# Patient Record
Sex: Male | Born: 1995 | Race: White | Hispanic: No | Marital: Single | Smoking: Former smoker
Health system: Southern US, Community
[De-identification: ages and names within clinical notes are randomized; demographics above are authoritative.]

## PROBLEM LIST (undated history)

## (undated) DIAGNOSIS — F419 Anxiety disorder, unspecified: Secondary | ICD-10-CM

## (undated) DIAGNOSIS — F329 Major depressive disorder, single episode, unspecified: Secondary | ICD-10-CM

## (undated) DIAGNOSIS — F32A Depression, unspecified: Secondary | ICD-10-CM

## (undated) HISTORY — PX: ANKLE SURGERY: SHX546

---

## 2017-07-03 ENCOUNTER — Ambulatory Visit (INDEPENDENT_AMBULATORY_CARE_PROVIDER_SITE_OTHER): Payer: BLUE CROSS/BLUE SHIELD | Admitting: Podiatry

## 2017-07-03 ENCOUNTER — Ambulatory Visit: Payer: BLUE CROSS/BLUE SHIELD

## 2017-07-03 DIAGNOSIS — M76822 Posterior tibial tendinitis, left leg: Secondary | ICD-10-CM | POA: Diagnosis not present

## 2017-07-03 DIAGNOSIS — M25579 Pain in unspecified ankle and joints of unspecified foot: Principal | ICD-10-CM

## 2017-07-03 DIAGNOSIS — G8929 Other chronic pain: Secondary | ICD-10-CM

## 2017-07-03 MED ORDER — BETAMETHASONE SOD PHOS & ACET 6 (3-3) MG/ML IJ SUSP: 3.0000 mg | Freq: Once | INTRAMUSCULAR | Status: AC

## 2017-07-03 NOTE — Progress Notes (Signed)
   Subjective:    Patient ID: Richard Horton, male    DOB: [birth year], 21 y.o.   MRN: [id number]  HPI    Review of Systems  All other systems reviewed and are negative.      Objective:   Physical Exam        Assessment & Plan:

## 2017-07-06 NOTE — Progress Notes (Signed)
    HPI: 21 year old male presenting today as a new patient with a complaint of aching pain to the medial and lateral aspects of bilateral ankles that began gradually about 5 months ago. He reports associated swelling. Running makes the symptoms worse. Resting helps alleviate the symptoms. He has been taking Ibuprofen with some relief. He is here for further evaluation and treatment.    No past medical history on file.    Physical Exam: General: The patient is alert and oriented x3 in no acute distress.  Dermatology: Skin is warm, dry and supple bilateral lower extremities. Negative for open lesions or macerations.  Vascular: Palpable pedal pulses bilaterally. No edema or erythema noted. Capillary refill within normal limits.  Neurological: Epicritic and protective threshold grossly intact bilaterally.   Musculoskeletal Exam: Pain on palpation noted to the posterior tibial tendon of the left foot. Range of motion within normal limits. Muscle strength 5/5 in all muscle groups bilateral lower extremities.  Radiographic Exam:  Normal osseous mineralization. Joint spaces preserved. No fracture or dislocation identified.    Assessment: 1. Posterior tibial tendinitis left 2. H/o left ankle surgery   Plan of Care:  1. Patient was evaluated. Radiographs were reviewed today. 2. Injection of 0.5 mL Celestone Soluspan injected into the posterior tibial tendon sheath.  3. Appt with Raiford Nobleick for custom molded orthotics.  4. Return to clinic in 8 weeks.   Felecia ShellingBrent M. Donnica Jarnagin, DPM [facility]  Dr. Felecia ShellingBrent M. Ricka Westra, DPM    [address]                                        [place], [place] [address]                Office [phone]  Fax [phone]

## 2017-07-08 ENCOUNTER — Encounter: Payer: Self-pay | Admitting: Podiatry

## 2017-07-08 ENCOUNTER — Ambulatory Visit (INDEPENDENT_AMBULATORY_CARE_PROVIDER_SITE_OTHER): Payer: BLUE CROSS/BLUE SHIELD | Admitting: Orthotics

## 2017-07-08 DIAGNOSIS — M76822 Posterior tibial tendinitis, left leg: Secondary | ICD-10-CM | POA: Diagnosis not present

## 2017-07-08 NOTE — Progress Notes (Signed)
Patient presents today with a hx of PTTD/AAF.  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  Patient has medially shifted talus/navicular.  Goal is provide longitudinal arch support and RF stability.  Plan on deep heel cup, hug arch, wide foot orthosis w/ medial flange and varus correction for RF valgus deformity.  Patient educated in the progessive nature of PTTD and financial responsibility.  

## 2017-08-04 ENCOUNTER — Encounter: Payer: BLUE CROSS/BLUE SHIELD | Admitting: Orthotics

## 2017-08-26 ENCOUNTER — Encounter: Payer: BLUE CROSS/BLUE SHIELD | Admitting: Orthotics

## 2017-09-02 ENCOUNTER — Ambulatory Visit: Payer: BLUE CROSS/BLUE SHIELD | Admitting: Orthotics

## 2017-09-02 DIAGNOSIS — M76822 Posterior tibial tendinitis, left leg: Secondary | ICD-10-CM

## 2017-09-02 NOTE — Progress Notes (Signed)
Patient came in today to pick up custom made foot orthotics.  The goals were accomplished and the patient reported no dissatisfaction with said orthotics.  Patient was advised of breakin period and how to report any issues. 

## 2017-11-29 DIAGNOSIS — F17200 Nicotine dependence, unspecified, uncomplicated: Secondary | ICD-10-CM | POA: Diagnosis not present

## 2017-11-29 DIAGNOSIS — R109 Unspecified abdominal pain: Secondary | ICD-10-CM | POA: Diagnosis present

## 2017-11-29 DIAGNOSIS — R197 Diarrhea, unspecified: Secondary | ICD-10-CM | POA: Insufficient documentation

## 2017-11-29 DIAGNOSIS — R1032 Left lower quadrant pain: Secondary | ICD-10-CM | POA: Insufficient documentation

## 2017-11-29 DIAGNOSIS — Z79899 Other long term (current) drug therapy: Secondary | ICD-10-CM | POA: Insufficient documentation

## 2017-11-29 DIAGNOSIS — I880 Nonspecific mesenteric lymphadenitis: Secondary | ICD-10-CM | POA: Diagnosis not present

## 2017-11-29 DIAGNOSIS — R110 Nausea: Secondary | ICD-10-CM | POA: Diagnosis not present

## 2017-11-30 ENCOUNTER — Encounter: Payer: Self-pay | Admitting: Radiology

## 2017-11-30 ENCOUNTER — Emergency Department
Admission: EM | Admit: 2017-11-30 | Discharge: 2017-11-30 | Disposition: A | Discharge status: Home / Self Care | Payer: BLUE CROSS/BLUE SHIELD | Attending: Emergency Medicine | Admitting: Emergency Medicine

## 2017-11-30 ENCOUNTER — Other Ambulatory Visit: Payer: Self-pay

## 2017-11-30 ENCOUNTER — Emergency Department: Payer: BLUE CROSS/BLUE SHIELD

## 2017-11-30 DIAGNOSIS — I880 Nonspecific mesenteric lymphadenitis: Secondary | ICD-10-CM

## 2017-11-30 DIAGNOSIS — R1032 Left lower quadrant pain: Secondary | ICD-10-CM

## 2017-11-30 LAB — CBC
HEMATOCRIT: 45.1 % (ref 40.0–52.0)
HEMOGLOBIN: 16.2 g/dL (ref 13.0–18.0)
MCH: 32.1 pg (ref 26.0–34.0)
MCHC: 35.8 g/dL (ref 32.0–36.0)
MCV: 89.7 fL (ref 80.0–100.0)
Platelets: 360 10*3/uL (ref 150–440)
RBC: 5.03 MIL/uL (ref 4.40–5.90)
RDW: 12.6 % (ref 11.5–14.5)
WBC: 10.8 10*3/uL — ABNORMAL HIGH (ref 3.8–10.6)

## 2017-11-30 LAB — COMPREHENSIVE METABOLIC PANEL
ALBUMIN: 4.5 g/dL (ref 3.5–5.0)
ALK PHOS: 93 U/L (ref 38–126)
ALT: 28 U/L (ref 17–63)
AST: 30 U/L (ref 15–41)
Anion gap: 7 (ref 5–15)
BUN: 14 mg/dL (ref 6–20)
CHLORIDE: 102 mmol/L (ref 101–111)
CO2: 31 mmol/L (ref 22–32)
Calcium: 9.2 mg/dL (ref 8.9–10.3)
Creatinine, Ser: 1.29 mg/dL — ABNORMAL HIGH (ref 0.61–1.24)
GFR calc Af Amer: >60 mL/min (ref >60)
GFR calc non Af Amer: >60 mL/min (ref >60)
GLUCOSE: 88 mg/dL (ref 65–99)
POTASSIUM: 4.3 mmol/L (ref 3.5–5.1)
SODIUM: 140 mmol/L (ref 135–145)
Total Bilirubin: 0.7 mg/dL (ref 0.3–1.2)
Total Protein: 7.3 g/dL (ref 6.5–8.1)

## 2017-11-30 LAB — LIPASE, BLOOD: Lipase: 25 U/L (ref 11–51)

## 2017-11-30 MED ORDER — IOPAMIDOL (ISOVUE-300) INJECTION 61%
100.0000 mL | Freq: Once | INTRAVENOUS | Status: AC | PRN
  Administered 2017-11-30: 100 mL via INTRAVENOUS

## 2017-11-30 MED ORDER — IOPAMIDOL (ISOVUE-300) INJECTION 61%
30.0000 mL | Freq: Once | INTRAVENOUS | Status: AC
  Administered 2017-11-30: 30 mL via ORAL

## 2017-11-30 MED ORDER — SODIUM CHLORIDE 0.9 % IV BOLUS
1000.0000 mL | Freq: Once | INTRAVENOUS | Status: AC
  Administered 2017-11-30: 1000 mL via INTRAVENOUS

## 2017-11-30 MED ORDER — KETOROLAC TROMETHAMINE 30 MG/ML IJ SOLN
30.0000 mg | Freq: Once | INTRAMUSCULAR | Status: AC
  Administered 2017-11-30: 30 mg via INTRAVENOUS
  Filled 2017-11-30: qty 1

## 2017-11-30 MED ORDER — ONDANSETRON HCL 4 MG/2ML IJ SOLN
4.0000 mg | Freq: Once | INTRAMUSCULAR | Status: AC
  Administered 2017-11-30: 4 mg via INTRAVENOUS
  Filled 2017-11-30: qty 2

## 2017-11-30 NOTE — ED Notes (Signed)
PT reports having abdomina pain that started this afternoon. Lower right quadrant. Is concerned it could be his appendix. Pt alert and oriented x 4.

## 2017-11-30 NOTE — ED Provider Notes (Signed)
[facility] Emergency Department Provider Note   ____________________________________________   First MD Initiated Contact with Patient 11/30/17 0401     (approximate)  I have reviewed the triage vital signs and the nursing notes.   HISTORY  Chief Complaint Abdominal Pain    HPI Richard Horton is a 22 y.o. male who comes into the hospital today stating that he is been having some discomfort in his appendix.  The patient states that he has some right-sided pain that started this afternoon.  He reports that it has not been getting as worse as he thought but he was concerned so he came in for evaluation.  The patient has had some nausea with no vomiting.  He denies any fever but has had some mild diarrhea.  The patient states that he was having pain whenever he was walking.  The patient reports his pain is currently a 4 out of 10 in intensity.  He is never had this before although as a child he did have some bowel issue.  He has not taken anything for pain at home.  The patient is here today for evaluation.   History reviewed. No pertinent past medical history.  There are no active problems to display for this patient.   Past Surgical History:  Procedure Laterality Date  . ANKLE SURGERY      Prior to Admission medications   Medication Sig Start Date End Date Taking? Authorizing Provider  amphetamine-dextroamphetamine (ADDERALL XR) 10 MG 24 hr capsule Take 10 mg by mouth daily.   Yes [provider]  FLUoxetine (PROZAC) 10 MG tablet Take 10 mg by mouth daily.   Yes [provider]    Allergies Sulfa antibiotics  No family history on file.  Social History Social History   Tobacco Use  . Smoking status: Current Every Day Smoker  . Smokeless tobacco: Never Used  Substance Use Topics  . Alcohol use: Never    Frequency: Never  . Drug use: Not on file    Review of Systems  Constitutional: No fever/chills Eyes: No visual  changes. ENT: No sore throat. Cardiovascular: Denies chest pain. Respiratory: Denies shortness of breath. Gastrointestinal: abdominal pain,  nausea, diarrhea no vomiting.   Genitourinary: Negative for dysuria. Musculoskeletal: Negative for back pain. Skin: Negative for rash. Neurological: Negative for headaches   ____________________________________________   PHYSICAL EXAM:  VITAL SIGNS: ED Triage Vitals [11/30/17 0054]  Enc Vitals Group     BP 139/81     Pulse Rate 97     Resp 16     Temp 98.7 F (37.1 C)     Temp Source Oral     SpO2 100 %     Weight 160 lb (72.6 kg)     Height 5\' 11"  (1.803 m)     Head Circumference      Peak Flow      Pain Score 5     Pain Loc      Pain Edu?      Excl. in GC?     Constitutional: Alert and oriented. Well appearing and in mild distress. Eyes: Conjunctivae are normal. PERRL. EOMI. Head: Atraumatic. Nose: No congestion/rhinnorhea. Mouth/Throat: Mucous membranes are moist.  Oropharynx non-erythematous. Cardiovascular: Normal rate, regular rhythm. Grossly normal heart sounds.  Good peripheral circulation. Respiratory: Normal respiratory effort.  No retractions. Lungs CTAB. Gastrointestinal: Soft with some LLQ tenderness to palpation. No distention. Positive bowel sounds Musculoskeletal: No lower extremity tenderness nor edema.  Neurologic:  Normal  speech and language.  Skin:  Skin is warm, dry and intact.  Psychiatric: Mood and affect are normal.   ____________________________________________   LABS (all labs ordered are listed, but only abnormal results are displayed)  Labs Reviewed  COMPREHENSIVE METABOLIC PANEL - Abnormal; Notable for the following components:      Result Value   Creatinine, Ser 1.29 (*)    All other components within normal limits  CBC - Abnormal; Notable for the following components:   WBC 10.8 (*)    All other components within normal limits  LIPASE, BLOOD    ____________________________________________  EKG  none ____________________________________________  RADIOLOGY  ED MD interpretation:  CT abd and pelvis: Mild prominence of mesenteric lymph nodes, possibly reactive to underlying enteritis or adenitis.  Normal appendix, otherwise normal CT of abdomen and pelvis.  Official radiology report(s): Ct Abdomen Pelvis W Contrast  Result Date: 11/30/2017 CLINICAL DATA:  22 y/o M; mid to right lower quadrant abdominal pain that started this afternoon. EXAM: CT ABDOMEN AND PELVIS WITH CONTRAST TECHNIQUE: Multidetector CT imaging of the abdomen and pelvis was performed using the standard protocol following bolus administration of intravenous contrast. CONTRAST:  ISOVUE-300 IOPAMIDOL (ISOVUE-300) INJECTION 61%, 30mL ISOVUE-300 IOPAMIDOL (ISOVUE-300) INJECTION 61% COMPARISON:  None. FINDINGS: Lower chest: No acute abnormality. Hepatobiliary: No focal liver abnormality is seen. No gallstones, gallbladder wall thickening, or biliary dilatation. Pancreas: Unremarkable. No pancreatic ductal dilatation or surrounding inflammatory changes. Spleen: Normal in size without focal abnormality. Adrenals/Urinary Tract: Adrenal glands are unremarkable. Kidneys are normal, without renal calculi, focal lesion, or hydronephrosis. Bladder is unremarkable. Stomach/Bowel: Stomach is within normal limits. Appendix appears normal. No evidence of bowel wall thickening, distention, or inflammatory changes. Vascular/Lymphatic: No significant vascular findings are present. Mild prominence of mesenteric lymph nodes. Reproductive: Prostate is unremarkable. Other: No abdominal wall hernia or abnormality. No abdominopelvic ascites. Musculoskeletal: No acute or significant osseous findings. IMPRESSION: Mild prominence of mesenteric lymph nodes, possibly reactive to underlying enteritis or adenitis. Otherwise normal CT of the abdomen and pelvis. Normal appendix. Electronically Signed    By: Mitzi Hansen M.D.   On: 11/30/2017 04:44    ____________________________________________   PROCEDURES  Procedure(s) performed: None  Procedures  Critical Care performed: No  ____________________________________________   INITIAL IMPRESSION / ASSESSMENT AND PLAN / ED COURSE  As part of my medical decision making, I reviewed the following data within the electronic MEDICAL RECORD NUMBER Notes from prior ED visits and [place] Controlled Substance Database   This is a 22 year old male who comes into the hospital today with some lower abdominal pain he states more on the right with a concern for his appendix.  My differential diagnosis includes mesenteric adenitis, appendicitis, epiploic appendicitis, colitis.  We did check some blood work which did show a white blood cell count of 10.8 but we will send the patient for a CT scan looking at his appendix.     The patient CT scan returned with some prominent mesenteric lymph nodes concerning for mesenteric adenitis.  I did give the patient a dose of Toradol.  He will be discharged home to follow-up with his primary care physician.  He has no further questions or concerns at this time. ____________________________________________   FINAL CLINICAL IMPRESSION(S) / ED DIAGNOSES  Final diagnoses:  Mesenteric adenitis  Left lower quadrant pain     ED Discharge Orders    None       Note:  This document was prepared using Dragon voice recognition software and may  include unintentional dictation errors.    Rebecka Apley, MD 11/30/17 [phone]

## 2017-11-30 NOTE — ED Notes (Signed)
Pt resting in recliner in triage 3, waiting to drink oral contrast for CT

## 2017-11-30 NOTE — Discharge Instructions (Addendum)
PLease follow up with your primary care physician for further evaluation of your abdominal pain.  °

## 2017-11-30 NOTE — ED Triage Notes (Signed)
Pt states that he is concerned that he may have appendicitis, pt states that he is having mid to right lower quad abd pain that started this afternoon, states that he had a small episode of diarrhea earlier and reports that he has increased painful sensation to the rlq with walking

## 2017-11-30 NOTE — ED Notes (Signed)
Pt has started drinking oral contrast.

## 2017-12-27 ENCOUNTER — Encounter: Payer: Self-pay | Admitting: *Deleted

## 2017-12-27 ENCOUNTER — Emergency Department
Admission: EM | Admit: 2017-12-27 | Discharge: 2017-12-28 | Disposition: A | Discharge status: Home / Self Care | Payer: BLUE CROSS/BLUE SHIELD | Attending: Emergency Medicine | Admitting: Emergency Medicine

## 2017-12-27 ENCOUNTER — Other Ambulatory Visit: Payer: Self-pay

## 2017-12-27 DIAGNOSIS — F1910 Other psychoactive substance abuse, uncomplicated: Secondary | ICD-10-CM | POA: Diagnosis not present

## 2017-12-27 DIAGNOSIS — F17210 Nicotine dependence, cigarettes, uncomplicated: Secondary | ICD-10-CM | POA: Diagnosis not present

## 2017-12-27 DIAGNOSIS — F10920 Alcohol use, unspecified with intoxication, uncomplicated: Secondary | ICD-10-CM | POA: Insufficient documentation

## 2017-12-27 DIAGNOSIS — Z79899 Other long term (current) drug therapy: Secondary | ICD-10-CM | POA: Diagnosis not present

## 2017-12-27 HISTORY — DX: Depression, unspecified: F32.A

## 2017-12-27 HISTORY — DX: Anxiety disorder, unspecified: F41.9

## 2017-12-27 HISTORY — DX: Major depressive disorder, single episode, unspecified: F32.9

## 2017-12-27 LAB — CBC WITH DIFFERENTIAL/PLATELET
BASOS ABS: 0.1 10*3/uL (ref 0–0.1)
Basophils Relative: 1 %
EOS ABS: 0.1 10*3/uL (ref 0–0.7)
Eosinophils Relative: 1 %
HCT: 48 % (ref 40.0–52.0)
Hemoglobin: 16.5 g/dL (ref 13.0–18.0)
LYMPHS ABS: 2.2 10*3/uL (ref 1.0–3.6)
Lymphocytes Relative: 19 %
MCH: 30.7 pg (ref 26.0–34.0)
MCHC: 34.5 g/dL (ref 32.0–36.0)
MCV: 89.2 fL (ref 80.0–100.0)
Monocytes Absolute: 0.5 10*3/uL (ref 0.2–1.0)
Monocytes Relative: 5 %
Neutro Abs: 9 10*3/uL — ABNORMAL HIGH (ref 1.4–6.5)
Neutrophils Relative %: 74 %
Platelets: 365 10*3/uL (ref 150–440)
RBC: 5.38 MIL/uL (ref 4.40–5.90)
RDW: 12.2 % (ref 11.5–14.5)
WBC: 12 10*3/uL — AB (ref 3.8–10.6)

## 2017-12-27 LAB — COMPREHENSIVE METABOLIC PANEL
ALK PHOS: 70 U/L (ref 38–126)
ALT: 26 U/L (ref 17–63)
AST: 29 U/L (ref 15–41)
Albumin: 4.5 g/dL (ref 3.5–5.0)
Anion gap: 10 (ref 5–15)
BILIRUBIN TOTAL: 1 mg/dL (ref 0.3–1.2)
BUN: 22 mg/dL — ABNORMAL HIGH (ref 6–20)
CO2: 24 mmol/L (ref 22–32)
CREATININE: 1.04 mg/dL (ref 0.61–1.24)
Calcium: 8.7 mg/dL — ABNORMAL LOW (ref 8.9–10.3)
Chloride: 102 mmol/L (ref 101–111)
Glucose, Bld: 138 mg/dL — ABNORMAL HIGH (ref 65–99)
Potassium: 3.1 mmol/L — ABNORMAL LOW (ref 3.5–5.1)
Sodium: 136 mmol/L (ref 135–145)
TOTAL PROTEIN: 7.1 g/dL (ref 6.5–8.1)

## 2017-12-27 LAB — SALICYLATE LEVEL

## 2017-12-27 LAB — ETHANOL: Alcohol, Ethyl (B): 214 mg/dL — ABNORMAL HIGH (ref <10)

## 2017-12-27 LAB — ACETAMINOPHEN LEVEL: Acetaminophen (Tylenol), Serum: <10 ug/mL — ABNORMAL LOW (ref 10–30)

## 2017-12-27 MED ORDER — ONDANSETRON HCL 4 MG/2ML IJ SOLN
INTRAMUSCULAR | Status: AC
  Administered 2017-12-27: 4 mg via INTRAVENOUS
  Filled 2017-12-27: qty 2

## 2017-12-27 MED ORDER — SODIUM CHLORIDE 0.9 % IV BOLUS
1000.0000 mL | Freq: Once | INTRAVENOUS | Status: AC
  Administered 2017-12-27: 1000 mL via INTRAVENOUS

## 2017-12-27 MED ORDER — ONDANSETRON HCL 4 MG/2ML IJ SOLN
4.0000 mg | Freq: Once | INTRAMUSCULAR | Status: AC
  Administered 2017-12-27: 4 mg via INTRAVENOUS

## 2017-12-27 NOTE — Discharge Instructions (Addendum)
Do not drink to excess, do not take any medications except for as they are directed.  Follow closely with primary care doctor.  If you feel that you have trouble with alcohol, please follow-up also with [facility] listed above.  If any new or worrisome symptoms please return to the emergency room.

## 2017-12-27 NOTE — ED Triage Notes (Signed)
Pt to ED via EMS from [facility] after roomates called after having found pt on the couch passed out with "purple lips" Pt reports he was drinking today and also took more adderall than normal to help him study. Pt reports multiple episodes of vomiting since having had 1 4 Loco. Pt is talking and answering questions approprietly upon arrival to ED. No Resp distress.NAD noted.    112/60 91HR 100% RA

## 2017-12-27 NOTE — ED Notes (Signed)
Rn confirmed with pt that it was okay to talk to his parents. Pt gave authorization and RN was able to update parents. Parents verbalize they are driving to the ED now and will be here in 4 hours. Parents have requested pt remain in ED until they arrive.

## 2017-12-27 NOTE — ED Provider Notes (Addendum)
[facility] Emergency Department Provider Note  ____________________________________________   I have reviewed the triage vital signs and the nursing notes. Where available I have reviewed prior notes and, if possible and indicated, outside hospital notes.    HISTORY  Chief Complaint Alcohol Intoxication    HPI Richard Horton is a 22 y.o. male who presents today after drinking alcohol, and took an extra Adderall to help him study.  Patient has no SI no HI, no trauma, denies other ingestion. No chills no chest pain or shortness of breath no abdominal pain no head injury   No past medical history on file.  There are no active problems to display for this patient.   Past Surgical History:  Procedure Laterality Date  . ANKLE SURGERY      Prior to Admission medications   Medication Sig Start Date End Date Taking? Authorizing Provider  amphetamine-dextroamphetamine (ADDERALL XR) 10 MG 24 hr capsule Take 10 mg by mouth daily.    [provider]  FLUoxetine (PROZAC) 10 MG tablet Take 10 mg by mouth daily.    [provider]    Allergies Sulfa antibiotics  No family history on file.  Social History Social History   Tobacco Use  . Smoking status: Current Every Day Smoker  . Smokeless tobacco: Never Used  Substance Use Topics  . Alcohol use: Never    Frequency: Never  . Drug use: Not on file    Review of Systems Constitutional: No fever/chills Eyes: No visual changes. ENT: No sore throat. No stiff neck no neck pain Cardiovascular: Denies chest pain. Respiratory: Denies shortness of breath. Gastrointestinal:   + vomiting.  No diarrhea.  No constipation. Genitourinary: Negative for dysuria. Musculoskeletal: Negative lower extremity swelling Skin: Negative for rash. Neurological: Negative for severe headaches, focal weakness or numbness.   ____________________________________________   PHYSICAL EXAM:  VITAL SIGNS: ED  Triage Vitals [12/27/17 2025]  Enc Vitals Group     BP      Pulse      Resp      Temp      Temp src      SpO2      Weight 175 lb (79.4 kg)     Height  (1.803 m)     Head Circumference      Peak Flow      Pain Score 0     Pain Loc      Pain Edu?      Excl. in GC?     Constitutional: She appears somewhat intoxicated but otherwise in no acute, converses normally if somewhat slowly Eyes: Conjunctivae are normal Head: Atraumatic HEENT: No congestion/rhinnorhea. Mucous membranes are moist.  Oropharynx non-erythematous Neck:   Nontender with no meningismus, no masses, no stridor Cardiovascular: Normal rate, regular rhythm. Grossly normal heart sounds.  Good peripheral circulation. Respiratory: Normal respiratory effort.  No retractions. Lungs CTAB. Abdominal: Soft and nontender. No distention. No guarding no rebound Back:  There is no focal tenderness or step off.  there is no midline tenderness there are no lesions noted. there is no CVA tenderness Musculoskeletal: No lower extremity tenderness, no upper extremity tenderness. No joint effusions, no DVT signs strong distal pulses no edema Neurologic:  Normal speech and language. No gross focal neurologic deficits are appreciated.  Skin:  Skin is warm, dry and intact. No rash noted. Psychiatric: Mood and affect are normal. Speech and behavior are normal.  ____________________________________________   LABS (all labs ordered are listed, but  only abnormal results are displayed)  Labs Reviewed  ACETAMINOPHEN LEVEL  ETHANOL  COMPREHENSIVE METABOLIC PANEL  SALICYLATE LEVEL  CBC WITH DIFFERENTIAL/PLATELET  URINALYSIS, COMPLETE (UACMP) WITH MICROSCOPIC  URINE DRUG SCREEN, QUALITATIVE ([facility] ONLY)    Pertinent labs  results that were available during my care of the patient were reviewed by me and considered in my medical decision making (see chart for details). ____________________________________________  EKG  I personally  interpreted any EKGs ordered by me or triage  ____________________________________________  RADIOLOGY  Pertinent labs & imaging results that were available during my care of the patient were reviewed by me and considered in my medical decision making (see chart for details). If possible, patient and/or family made aware of any abnormal findings.  No results found. ____________________________________________    PROCEDURES  Procedure(s) performed: None  Procedures  Critical Care performed: None  ____________________________________________   INITIAL IMPRESSION / ASSESSMENT AND PLAN / ED COURSE  Pertinent labs & imaging results that were available during my care of the patient were reviewed by me and considered in my medical decision making (see chart for details).  Patient is nontoxic in appearance, apparently did vomit after taking extra medications and "for loco" today.  He has no SI or HI, he is adamant that this was not a suicide attempt.  We will check basic blood work, Tylenol and salicylate levels as a precaution, give him IV fluid and watch him closely  ----------------------------------------- 11:12 PM on 12/27/2017 -----------------------------------------  And awake and alert no acute distress, he is somewhat anxious at this time as his family members are with him, patient does give consent to have his parents be made aware of all the findings.  Patient has been unable to urinate for [place], he states that he has trouble when people are expecting urine.  We are giving him IV fluid.  It is my anticipation that the patient will likely be dischargeable soon after he sobers up a little more.  Again I have questioned the patient and again there is no SI or HI, he therefore does not meet criteria for being committed.  He took one extra Adderall.    ____________________________________________   FINAL CLINICAL IMPRESSION(S) / ED DIAGNOSES  Final diagnoses:  None       This chart was dictated using voice recognition software.  Despite best efforts to proofread,  errors can occur which can change meaning.      Jeanmarie Plant, MD 12/27/17 2027    Jeanmarie Plant, MD 12/27/17 [phone]

## 2017-12-27 NOTE — ED Notes (Signed)
Patient given water at this time.  

## 2017-12-27 NOTE — ED Notes (Signed)
Pts father called again and was able to talk to patient. Patients father reports he is now 90 minutes away and coming to pick pt up after discharge.

## 2017-12-27 NOTE — ED Notes (Signed)
Pt had 1 episode of vomiting. MD aware.

## 2017-12-28 LAB — URINALYSIS, COMPLETE (UACMP) WITH MICROSCOPIC
Bilirubin Urine: NEGATIVE
Glucose, UA: 50 mg/dL — AB
KETONES UR: NEGATIVE mg/dL
Leukocytes, UA: NEGATIVE
Nitrite: NEGATIVE
PH: 6 (ref 5.0–8.0)
PROTEIN: NEGATIVE mg/dL
Specific Gravity, Urine: 1.01 (ref 1.005–1.030)
Squamous Epithelial / LPF: NONE SEEN (ref 0–5)

## 2017-12-28 LAB — URINE DRUG SCREEN, QUALITATIVE (ARMC ONLY)
AMPHETAMINES, UR SCREEN: POSITIVE — AB
Barbiturates, Ur Screen: NOT DETECTED
Benzodiazepine, Ur Scrn: NOT DETECTED
COCAINE METABOLITE, UR ~~LOC~~: NOT DETECTED
Cannabinoid 50 Ng, Ur ~~LOC~~: POSITIVE — AB
MDMA (Ecstasy)Ur Screen: NOT DETECTED
METHADONE SCREEN, URINE: NOT DETECTED
OPIATE, UR SCREEN: NOT DETECTED
Phencyclidine (PCP) Ur S: NOT DETECTED
Tricyclic, Ur Screen: NOT DETECTED

## 2017-12-28 NOTE — ED Provider Notes (Signed)
-----------------------------------------   12:57 AM on 12/28/2017 -----------------------------------------   Blood pressure 113/65, pulse 99, resp. rate (!) 25, height  (1.803 m), weight 79.4 kg (175 lb), SpO2 100 %.  Assuming care from Dr. Alphonzo Lemmings.  In short, Richard Horton is a 22 y.o. male with a chief complaint of Alcohol Intoxication .  Refer to the original H&P for additional details.  The current plan of care is to follow up the results of the urine drug screen and disposition the patient.  The patient's urine drug screen is positive for amphetamines and cannabinoids.  The patient otherwise has no complaints.  He is awake and is requesting to be discharged home.  His family is here to take him home.  He will be discharged.        Rebecka Apley, MD 12/28/17 [phone]

## 2017-12-28 NOTE — ED Notes (Addendum)
Patient up to bathroom at this time.

## 2019-04-15 IMAGING — CT CT ABD-PELV W/ CM
2 of 4 series · 16 of 46 positions shown, 18 images · IV contrast (APPLIED)
Comparison: None.

CLINICAL DATA: 21 y/o M; mid to right lower quadrant abdominal pain
that started this afternoon.

EXAM:
CT ABDOMEN AND PELVIS WITH CONTRAST
TECHNIQUE: Multidetector CT imaging of the abdomen and pelvis was performed
using the standard protocol following bolus administration of
intravenous contrast.
CONTRAST:  100mL PSUKUZ-G11 IOPAMIDOL (PSUKUZ-G11) INJECTION 61%,
30mL PSUKUZ-G11 IOPAMIDOL (PSUKUZ-G11) INJECTION 61%

[Series 2: routine abd/pel with · axial · 0.70mm/px · z∈[-948,-508]mm · 13 of 97 slices shown, 15 images]
[im 5/97  soft-tissue]
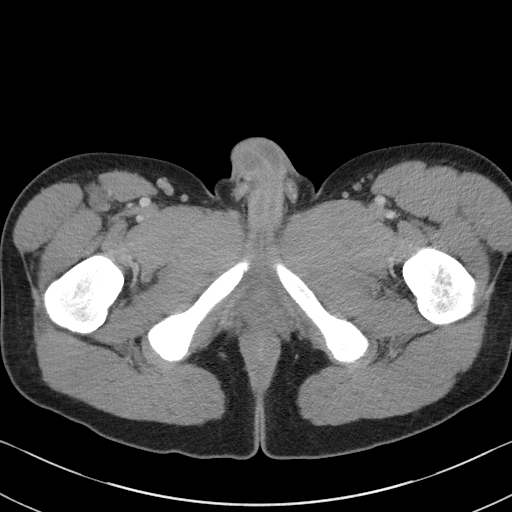
[im 5/97  bone]
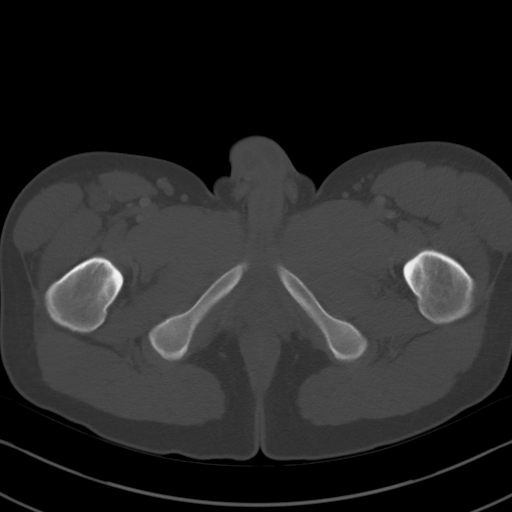
[im 13/97  soft-tissue]
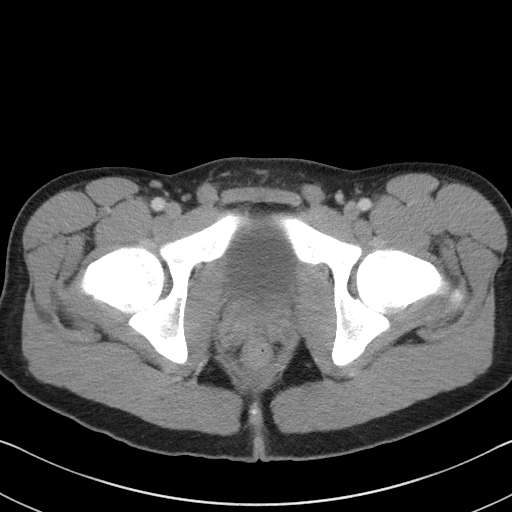
[im 21/97  soft-tissue]
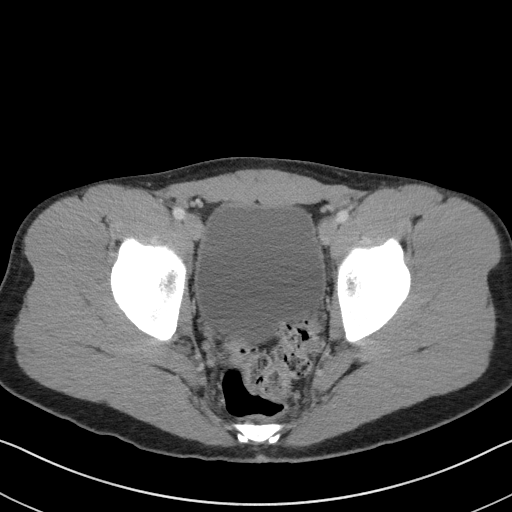
[im 29/97  soft-tissue]
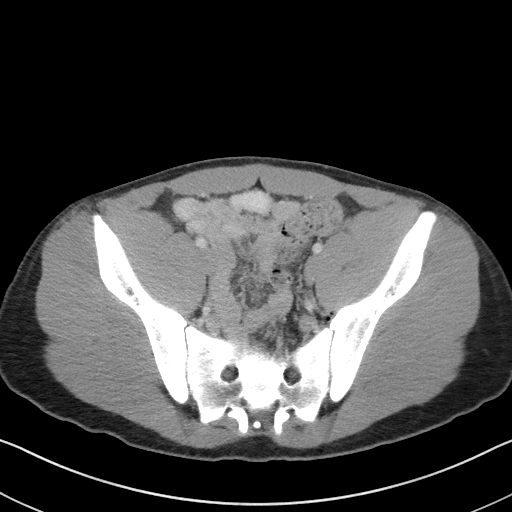
[im 33/97  soft-tissue]
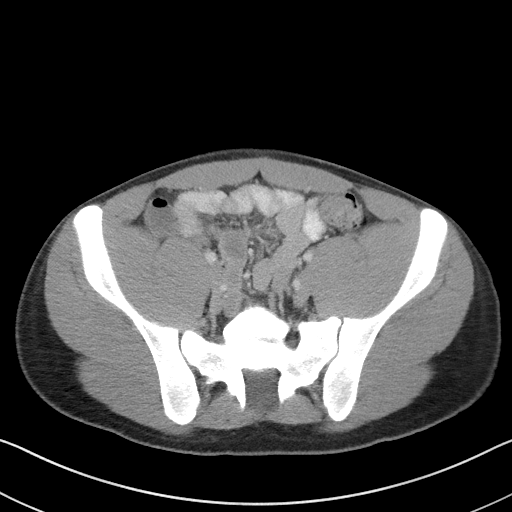
[im 41/97  soft-tissue]
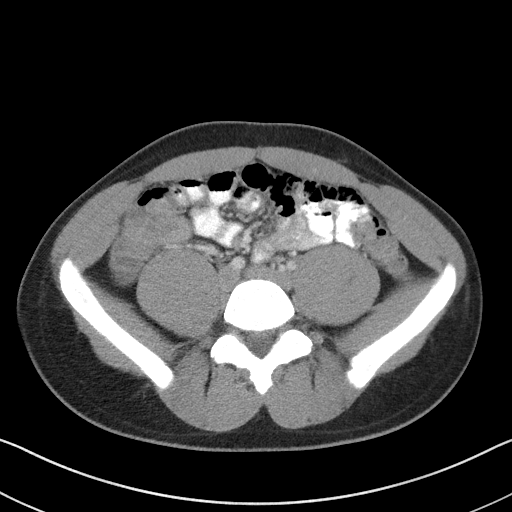
[im 49/97  soft-tissue]
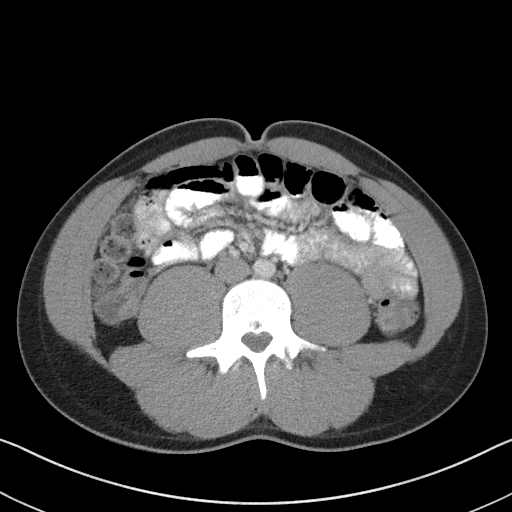
[im 57/97  soft-tissue]
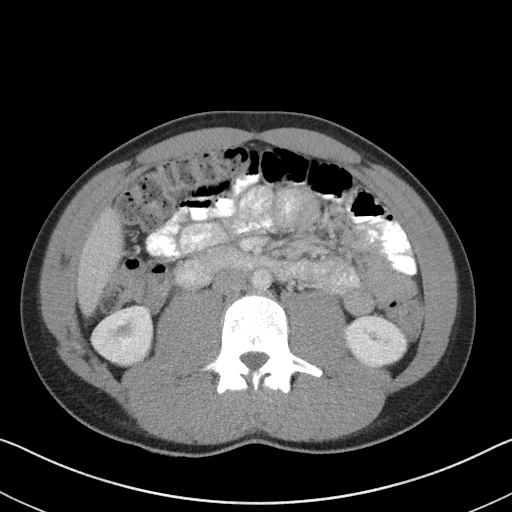
[im 65/97  soft-tissue]
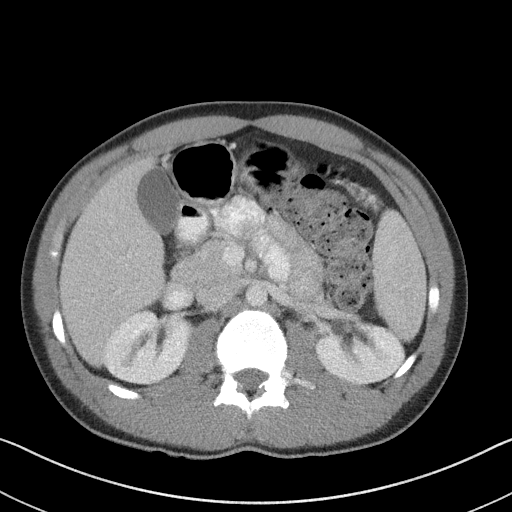
[im 65/97  bone]
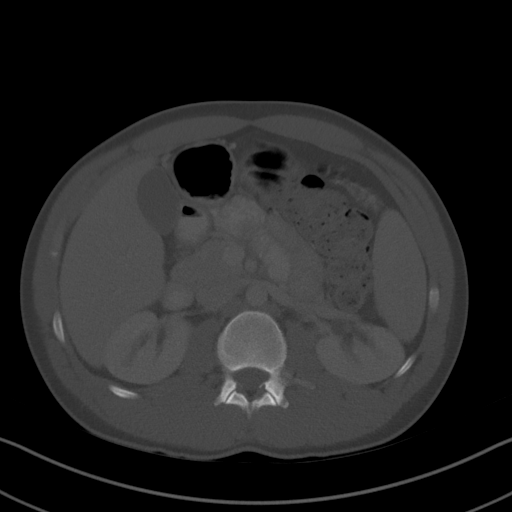
[im 69/97  soft-tissue]
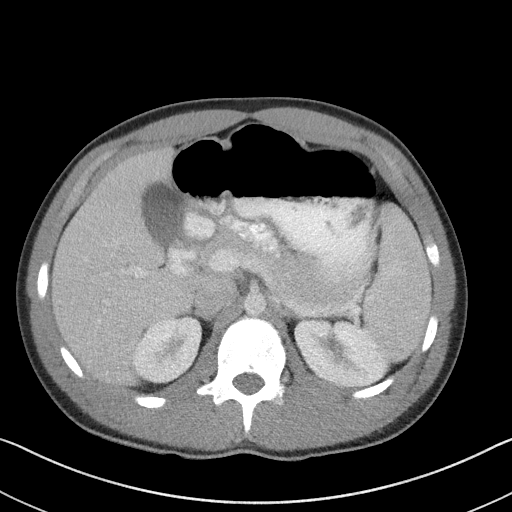
[im 77/97  soft-tissue]
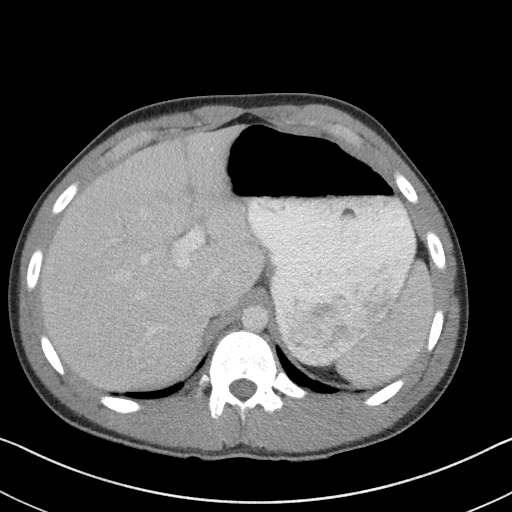
[im 85/97  soft-tissue]
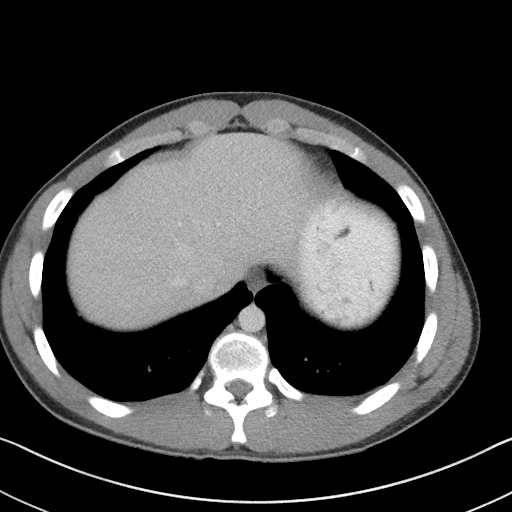
[im 93/97  soft-tissue]
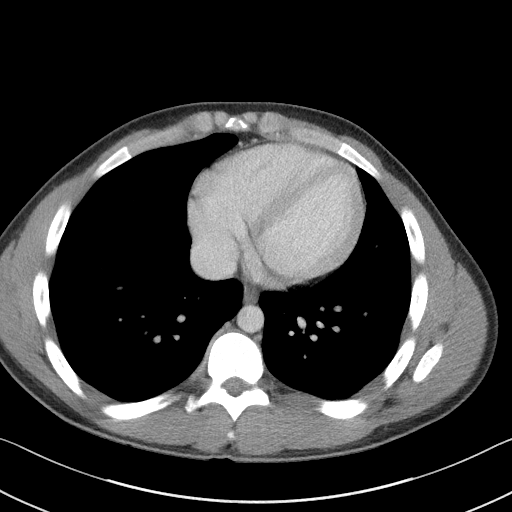

[Series 5: coronal st · coronal · 0.70mm/px · 3 of 88 slices shown]
[im 30/88  soft-tissue]
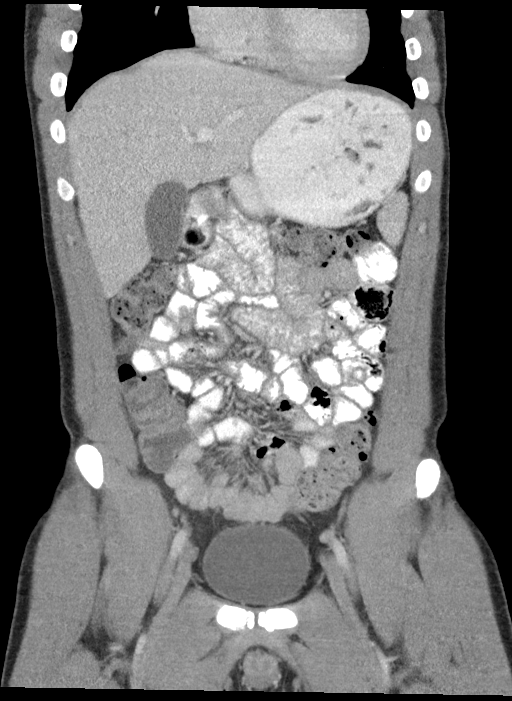
[im 39/88  soft-tissue]
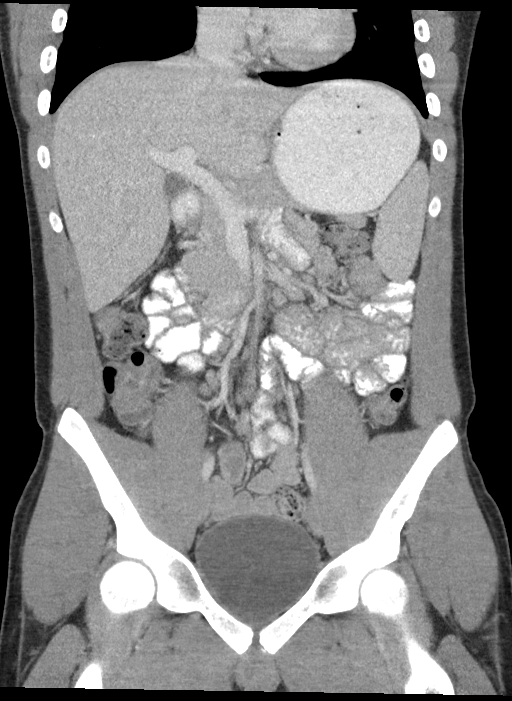
[im 49/88  soft-tissue]
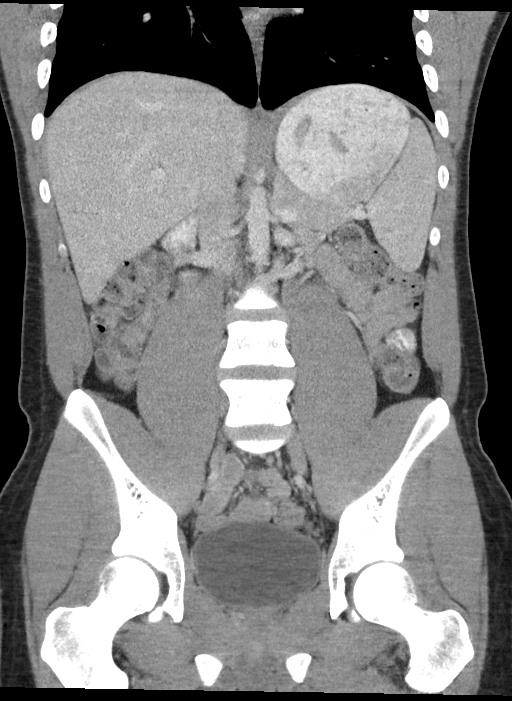

[16 of 46 positions shown; findings below may reference images not displayed]

FINDINGS: Lower chest: No acute abnormality.

Hepatobiliary: No focal liver abnormality is seen. No gallstones,
gallbladder wall thickening, or biliary dilatation.

Pancreas: Unremarkable. No pancreatic ductal dilatation or
surrounding inflammatory changes.

Spleen: Normal in size without focal abnormality.

Adrenals/Urinary Tract: Adrenal glands are unremarkable. Kidneys are
normal, without renal calculi, focal lesion, or hydronephrosis.
Bladder is unremarkable.

Stomach/Bowel: Stomach is within normal limits. Appendix appears
normal. No evidence of bowel wall thickening, distention, or
inflammatory changes.

Vascular/Lymphatic: No significant vascular findings are present.
Mild prominence of mesenteric lymph nodes.

Reproductive: Prostate is unremarkable.

Other: No abdominal wall hernia or abnormality. No abdominopelvic
ascites.

Musculoskeletal: No acute or significant osseous findings.
IMPRESSION: Mild prominence of mesenteric lymph nodes, possibly reactive to
underlying enteritis or adenitis. Otherwise normal CT of the abdomen
and pelvis. Normal appendix.

By: Niakoo Lortgifanidze M.D.
